# Patient Record
Sex: Female | Born: 1935 | Race: Black or African American | Hispanic: No | Smoking: Never smoker
Health system: Southern US, Community
[De-identification: ages and names within clinical notes are randomized; demographics above are authoritative.]

## PROBLEM LIST (undated history)

## (undated) DIAGNOSIS — F32A Depression, unspecified: Secondary | ICD-10-CM

## (undated) DIAGNOSIS — I10 Essential (primary) hypertension: Secondary | ICD-10-CM

## (undated) DIAGNOSIS — F329 Major depressive disorder, single episode, unspecified: Secondary | ICD-10-CM

## (undated) DIAGNOSIS — Q228 Other congenital malformations of tricuspid valve: Secondary | ICD-10-CM

## (undated) DIAGNOSIS — M8580 Other specified disorders of bone density and structure, unspecified site: Secondary | ICD-10-CM

## (undated) DIAGNOSIS — I341 Nonrheumatic mitral (valve) prolapse: Secondary | ICD-10-CM

## (undated) DIAGNOSIS — I369 Nonrheumatic tricuspid valve disorder, unspecified: Secondary | ICD-10-CM

## (undated) DIAGNOSIS — G4700 Insomnia, unspecified: Secondary | ICD-10-CM

## (undated) DIAGNOSIS — M1990 Unspecified osteoarthritis, unspecified site: Secondary | ICD-10-CM

## (undated) DIAGNOSIS — F39 Unspecified mood [affective] disorder: Secondary | ICD-10-CM

## (undated) DIAGNOSIS — J309 Allergic rhinitis, unspecified: Secondary | ICD-10-CM

## (undated) DIAGNOSIS — K219 Gastro-esophageal reflux disease without esophagitis: Secondary | ICD-10-CM

## (undated) DIAGNOSIS — E7800 Pure hypercholesterolemia, unspecified: Secondary | ICD-10-CM

## (undated) DIAGNOSIS — K635 Polyp of colon: Secondary | ICD-10-CM

## (undated) DIAGNOSIS — M810 Age-related osteoporosis without current pathological fracture: Secondary | ICD-10-CM

## (undated) HISTORY — DX: Nonrheumatic mitral (valve) prolapse: I34.1

## (undated) HISTORY — PX: CARPAL TUNNEL RELEASE: SHX101

## (undated) HISTORY — DX: Polyp of colon: K63.5

## (undated) HISTORY — DX: Other specified disorders of bone density and structure, unspecified site: M85.80

## (undated) HISTORY — DX: Major depressive disorder, single episode, unspecified: F32.9

## (undated) HISTORY — DX: Insomnia, unspecified: G47.00

## (undated) HISTORY — PX: BUNIONECTOMY: SHX129

## (undated) HISTORY — DX: Other congenital malformations of tricuspid valve: Q22.8

## (undated) HISTORY — DX: Nonrheumatic tricuspid valve disorder, unspecified: I36.9

## (undated) HISTORY — DX: Depression, unspecified: F32.A

## (undated) HISTORY — DX: Unspecified osteoarthritis, unspecified site: M19.90

## (undated) HISTORY — DX: Age-related osteoporosis without current pathological fracture: M81.0

## (undated) HISTORY — DX: Allergic rhinitis, unspecified: J30.9

## (undated) HISTORY — PX: ABDOMINAL HYSTERECTOMY: SHX81

---

## 2007-12-11 LAB — HM DEXA SCAN

## 2010-01-10 LAB — HM MAMMOGRAPHY

## 2012-03-20 ENCOUNTER — Encounter (HOSPITAL_BASED_OUTPATIENT_CLINIC_OR_DEPARTMENT_OTHER): Payer: Self-pay | Admitting: Emergency Medicine

## 2012-03-20 ENCOUNTER — Emergency Department (HOSPITAL_BASED_OUTPATIENT_CLINIC_OR_DEPARTMENT_OTHER): Payer: Medicare Other

## 2012-03-20 ENCOUNTER — Emergency Department (HOSPITAL_BASED_OUTPATIENT_CLINIC_OR_DEPARTMENT_OTHER)
Admission: EM | Admit: 2012-03-20 | Discharge: 2012-03-20 | Disposition: A | Discharge status: Home / Self Care | Payer: Medicare Other | Attending: Emergency Medicine | Admitting: Emergency Medicine

## 2012-03-20 DIAGNOSIS — Z880 Allergy status to penicillin: Secondary | ICD-10-CM | POA: Insufficient documentation

## 2012-03-20 DIAGNOSIS — M25569 Pain in unspecified knee: Secondary | ICD-10-CM | POA: Insufficient documentation

## 2012-03-20 DIAGNOSIS — Z882 Allergy status to sulfonamides status: Secondary | ICD-10-CM | POA: Insufficient documentation

## 2012-03-20 DIAGNOSIS — E7800 Pure hypercholesterolemia, unspecified: Secondary | ICD-10-CM | POA: Insufficient documentation

## 2012-03-20 DIAGNOSIS — Z79899 Other long term (current) drug therapy: Secondary | ICD-10-CM | POA: Insufficient documentation

## 2012-03-20 DIAGNOSIS — Z886 Allergy status to analgesic agent status: Secondary | ICD-10-CM | POA: Insufficient documentation

## 2012-03-20 DIAGNOSIS — Z91013 Allergy to seafood: Secondary | ICD-10-CM | POA: Insufficient documentation

## 2012-03-20 DIAGNOSIS — M25561 Pain in right knee: Secondary | ICD-10-CM

## 2012-03-20 DIAGNOSIS — E785 Hyperlipidemia, unspecified: Secondary | ICD-10-CM | POA: Insufficient documentation

## 2012-03-20 DIAGNOSIS — X500XXA Overexertion from strenuous movement or load, initial encounter: Secondary | ICD-10-CM | POA: Insufficient documentation

## 2012-03-20 DIAGNOSIS — K219 Gastro-esophageal reflux disease without esophagitis: Secondary | ICD-10-CM | POA: Insufficient documentation

## 2012-03-20 HISTORY — DX: Essential (primary) hypertension: I10

## 2012-03-20 HISTORY — DX: Pure hypercholesterolemia, unspecified: E78.00

## 2012-03-20 HISTORY — DX: Unspecified mood (affective) disorder: F39

## 2012-03-20 HISTORY — DX: Gastro-esophageal reflux disease without esophagitis: K21.9

## 2012-03-20 MED ORDER — HYDROCODONE-ACETAMINOPHEN 5-325 MG PO TABS: 2.0000 | ORAL_TABLET | Freq: Four times a day (QID) | ORAL | Status: AC | PRN

## 2012-03-20 NOTE — ED Provider Notes (Signed)
History     CSN: [id number]  Arrival date & time 03/20/12  1747   First MD Initiated Contact with Patient 03/20/12 1830      Chief Complaint  Patient presents with  . Knee Pain    (Consider location/radiation/quality/duration/timing/severity/associated sxs/prior treatment) HPI 78 year old female was trying to get into her car just prior to arrival when her right knee gave out and she almost fell to the ground. She didn't strike her right knee on the ground but did not fall completely and she was able to catch herself with her arms against the car. She was able to walk after this fall. She has no distal weakness or numbness to the right leg. There is no swelling or deformity to the right knee but she does have right knee pain which is localized without radiation or associated symptoms. She did not hit her head and she is no neck pain or back pain. She is no amnesia. She is not lightheaded. She is no chest pain shortness breath abdominal pain. She is no injury to her arms and no pain to her left leg now. She is isolated pain to the right knee but no pain to the right hip ankle or foot. There is no treatment prior to arrival. Her pain is moderately severe but she does not want pain medicines in the emergency department Past Medical History  Diagnosis Date  . Hypertension   . High cholesterol   . GERD (gastroesophageal reflux disease)   . Mood disorder     Past Surgical History  Procedure Date  . Abdominal hysterectomy   . Carpal tunnel release     No family history on file.  History  Substance Use Topics  . Smoking status: Never Smoker   . Smokeless tobacco: Not on file  . Alcohol Use: No    OB History    Grav Para Term Preterm Abortions TAB SAB Ect Mult Living                  Review of Systems 10 Systems reviewed and are negative for acute change except as noted in the HPI. Allergies  Penicillins; Shrimp; Aspirin; and Sulfa antibiotics  Home Medications   Current  Outpatient Rx  Name Route Sig Dispense Refill  . ACETAMINOPHEN 325 MG PO TABS Oral Take 650 mg by mouth every 6 (six) hours as needed. For pain.    . DEXLANSOPRAZOLE 60 MG PO CPDR Oral Take 60 mg by mouth daily.    . DULOXETINE HCL 60 MG PO CPEP Oral Take 60 mg by mouth daily.    Marland Kitchen EZETIMIBE 10 MG PO TABS Oral Take 10 mg by mouth daily.    Marland Kitchen HYDROCODONE-ACETAMINOPHEN 5-325 MG PO TABS Oral Take 2 tablets by mouth every 6 (six) hours as needed for pain. 20 tablet 0  . HYDROXYZINE HCL 25 MG PO TABS Oral Take 25 mg by mouth 3 (three) times daily as needed.    Marland Kitchen POTASSIUM CHLORIDE ER 10 MEQ PO TBCR Oral Take 10 mEq by mouth 2 (two) times daily. Patient uses this medication prn.    Marland Kitchen ROSUVASTATIN CALCIUM 20 MG PO TABS Oral Take 20 mg by mouth daily.    Marland Kitchen MICARDIS PO Oral Take 1 tablet by mouth daily.    . TOLTERODINE TARTRATE ER 4 MG PO CP24 Oral Take 4 mg by mouth daily.      BP 124/56  Pulse 63  Temp 98.3 F (36.8 C) (Oral)  Resp 16  Ht 5\' 1"  (1.549 m)  Wt 165 lb (74.844 kg)  BMI 31.18 kg/m2  SpO2 98%  Physical Exam  Nursing note and vitals reviewed. Constitutional:       Awake, alert, nontoxic appearance.  HENT:  Head: Atraumatic.  Eyes: Right eye exhibits no discharge. Left eye exhibits no discharge.  Neck: Neck supple.       Cervical spine nontender  Pulmonary/Chest: Effort normal. She exhibits no tenderness.  Abdominal: Soft. There is no tenderness. There is no rebound.  Musculoskeletal: She exhibits tenderness. She exhibits no edema.       Baseline ROM, no obvious new focal weakness. Both arms and left leg nontender. Right leg is nontender at the hip thigh calf ankle and foot. Foot is normal light touch good range of motion and intact strength and capillary refill less than 2 seconds. Right knee has tenderness anteriorly as well as the medial and lateral joint lines however the patient has active full extension as well as flexion past 90, she's negative Lachman's testing and no  instability or worsening pain with varus or valgus stress testing. She also has negative McMurray's testing.  Neurological:       Mental status and motor strength appears baseline for patient and situation.  Skin: No rash noted.  Psychiatric: She has a normal mood and affect.    ED Course  Procedures (including critical care time)  Labs Reviewed - No data to display Dg Knee Complete 4 Views Right  03/20/2012  *RADIOLOGY REPORT*  Clinical Data: Larey Seat and injured right knee.  Pain.  RIGHT KNEE - COMPLETE 4+ VIEW  Comparison: None.  Findings: Osteopenia.  No evidence of acute fracture or dislocation.  Tricompartment joint space narrowing and associated hypertrophic spurring, severe.  Chondrocalcinosis involving the lateral meniscus.  Large joint effusion with calcified loose body in the joint.  IMPRESSION: No acute osseous abnormality.  Osteopenia.  Severe tricompartment osteoarthritis secondary to CPPD.  Large joint effusion with calcified loose body.  Original Report Authenticated By: Arnell Sieving, M.D.     1. Right knee pain       MDM  Patient / Family / Caregiver informed of clinical course, understand medical decision-making process, and agree with plan.         Hurman Horn, MD 03/21/12 [id number]

## 2012-03-20 NOTE — ED Notes (Signed)
Pt's "knee gave out" when she was trying to get in the car.  Went down to pavement on her right knee.  Did not fall all the way down to the ground.  Skin is intact.

## 2013-01-06 ENCOUNTER — Ambulatory Visit (INDEPENDENT_AMBULATORY_CARE_PROVIDER_SITE_OTHER): Payer: Medicare Other | Admitting: Family Medicine

## 2013-01-06 ENCOUNTER — Encounter: Payer: Self-pay | Admitting: Family Medicine

## 2013-01-06 VITALS — BP 129/79 | HR 67 | Wt 164.0 lb

## 2013-01-06 DIAGNOSIS — N952 Postmenopausal atrophic vaginitis: Secondary | ICD-10-CM | POA: Insufficient documentation

## 2013-01-06 DIAGNOSIS — R300 Dysuria: Secondary | ICD-10-CM | POA: Insufficient documentation

## 2013-01-06 DIAGNOSIS — B3731 Acute candidiasis of vulva and vagina: Secondary | ICD-10-CM

## 2013-01-06 DIAGNOSIS — R5381 Other malaise: Secondary | ICD-10-CM | POA: Insufficient documentation

## 2013-01-06 DIAGNOSIS — B373 Candidiasis of vulva and vagina: Secondary | ICD-10-CM

## 2013-01-06 DIAGNOSIS — R5383 Other fatigue: Secondary | ICD-10-CM

## 2013-01-06 LAB — POCT URINALYSIS DIPSTICK
Bilirubin, UA: NEGATIVE
Blood, UA: NEGATIVE
Glucose, UA: NEGATIVE
Ketones, UA: NEGATIVE
Leukocytes, UA: NEGATIVE
Nitrite, UA: NEGATIVE
Protein, UA: NEGATIVE
Spec Grav, UA: 1.02
Urobilinogen, UA: NEGATIVE
pH, UA: 6.5

## 2013-01-06 MED ORDER — CLOBETASOL PROPIONATE 0.05 % EX CREA: TOPICAL_CREAM | Freq: Two times a day (BID) | CUTANEOUS | Status: AC

## 2013-01-06 MED ORDER — FLUCONAZOLE 200 MG PO TABS: ORAL_TABLET | ORAL | Status: DC

## 2013-01-06 MED ORDER — CYANOCOBALAMIN 1000 MCG/ML IJ SOLN
1000.0000 ug | Freq: Once | INTRAMUSCULAR | Status: AC
  Administered 2013-01-06: 1000 ug via INTRAMUSCULAR

## 2013-01-06 NOTE — Assessment & Plan Note (Signed)
Treating with Diflucan. 

## 2013-01-06 NOTE — Assessment & Plan Note (Signed)
She received a Vitamin B-12 shot.

## 2013-01-06 NOTE — Assessment & Plan Note (Signed)
She was given a prescription for Clobetasol cream.

## 2013-01-06 NOTE — Assessment & Plan Note (Signed)
Her U/A was clear.

## 2013-01-06 NOTE — Patient Instructions (Addendum)
1)  Vaginal Itching/Irritation:  Take one pill (Diflucan) every other day for a week and apply the cream twice day till itching has improved.   Atrophic Vaginitis Atrophic vaginitis is a problem of low levels of estrogen in women. This problem can happen at any age. It is most common in women who have gone through menopause ("the change").  HOW WILL I KNOW IF I HAVE THIS PROBLEM? You may have:  Trouble with peeing (urinating), such as:  Going to the bathroom often.  A hard time holding your pee until you reach a bathroom.  Leaking pee.  Having pain when you pee.  Itching or a burning feeling.  Vaginal bleeding and spotting.  Pain during sex.  Dryness of the vagina.  A yellow, bad-smelling fluid (discharge) coming from the vagina. HOW WILL MY DOCTOR CHECK FOR THIS PROBLEM?  During your exam, your doctor will likely find the problem.  If there is a vaginal fluid, it may be checked for infection. HOW WILL THIS PROBLEM BE TREATED? Keep the vulvar skin as clean as possible. Moisturizers and lubricants can help with some of the symptoms. Estrogen replacement can help. There are 2 ways to take estrogen:  Systemic estrogen gets estrogen to your whole body. It takes many weeks or months before the symptoms get better.  You take an estrogen pill.  You use a skin patch. This is a patch that you put on your skin.  If you still have your uterus, your doctor may ask you to take a hormone. Talk to your doctor about the right medicine for you.  Estrogen cream.  This puts estrogen only at the part of your body where you apply it. The cream is put into the vagina or put on the vulvar skin. For some women, estrogen cream works faster than pills or the patch. CAN ALL WOMEN WITH THIS PROBLEM USE ESTROGEN? No. Women with certain types of cancer, liver problems, or problems with blood clots should not take estrogen. Your doctor can help you decide the best treatment for your symptoms. Document  Released: 01/15/2008 Document Revised: 10/21/2011 Document Reviewed: 01/15/2008 [facility] Patient Information 2014 [place], [place].

## 2013-01-06 NOTE — Progress Notes (Signed)
  Subjective:    Patient ID: Lori Fitzgerald, female    DOB: 1930-09-09, 79 y.o.   MRN: [id number]  HPI  Lori Fitzgerald is here today complaining of UTI symptoms and vaginal irritation.  She has been having these symptoms for about five days.  She describes her symptoms as being moderate in nature and slowly improving.  She has tried Azo and Cranberry tablets which have helped her bladder discomfort.  She has also put topical antibiotics in her vaginal area which has worsened  her itching.    Review of Systems  Genitourinary: Positive for dysuria and frequency.      Objective:   Physical Exam  Constitutional: No distress.  Genitourinary: No vaginal discharge found.  Skin: Skin is warm and dry. There is erythema.          Assessment & Plan:

## 2013-01-26 ENCOUNTER — Ambulatory Visit: Payer: Medicare Other | Admitting: Family Medicine

## 2013-02-02 ENCOUNTER — Encounter: Payer: Self-pay | Admitting: Family Medicine

## 2013-02-02 ENCOUNTER — Ambulatory Visit (INDEPENDENT_AMBULATORY_CARE_PROVIDER_SITE_OTHER): Payer: Medicare Other | Admitting: Family Medicine

## 2013-02-02 VITALS — BP 130/75 | HR 67 | Temp 98.0°F | Resp 18 | Wt 164.0 lb

## 2013-02-02 DIAGNOSIS — G4700 Insomnia, unspecified: Secondary | ICD-10-CM

## 2013-02-02 DIAGNOSIS — M25519 Pain in unspecified shoulder: Secondary | ICD-10-CM

## 2013-02-02 DIAGNOSIS — R109 Unspecified abdominal pain: Secondary | ICD-10-CM

## 2013-02-02 DIAGNOSIS — R5381 Other malaise: Secondary | ICD-10-CM

## 2013-02-02 DIAGNOSIS — R5383 Other fatigue: Secondary | ICD-10-CM

## 2013-02-02 DIAGNOSIS — M25512 Pain in left shoulder: Secondary | ICD-10-CM

## 2013-02-02 LAB — LIPASE: Lipase: 29 U/L (ref 0–75)

## 2013-02-02 LAB — AMYLASE: Amylase: 51 U/L (ref 0–105)

## 2013-02-02 MED ORDER — CYANOCOBALAMIN 1000 MCG/ML IJ SOLN
1000.0000 ug | Freq: Once | INTRAMUSCULAR | Status: AC
  Administered 2013-02-02: 1000 ug via INTRAMUSCULAR

## 2013-02-02 MED ORDER — PROMETHAZINE HCL 12.5 MG PO TABS: ORAL_TABLET | ORAL | Status: DC

## 2013-02-02 MED ORDER — CYANOCOBALAMIN 1000 MCG/ML IJ SOLN: 1000.0000 ug | Freq: Once | INTRAMUSCULAR | Status: DC

## 2013-02-02 NOTE — Progress Notes (Signed)
  Subjective:    Patient ID: Lori Fitzgerald, female    DOB: [birth year], 79 y.o.   MRN: [id number]  [name] is in today complaining of both abdominal (RLQ) and shoulder pain (Left).     Abdominal Pain This is a new problem. The current episode started in the past 7 days. The problem occurs daily. The problem has been unchanged. The pain is located in the RLQ. The pain is at a severity of 5/10. The pain is moderate. The quality of the pain is burning. The abdominal pain radiates to the periumbilical region. Associated symptoms include constipation, headaches, nausea and vomiting (She vomited once last Wednesday.). Arthralgias: Left Shoulder  The pain is aggravated by eating, movement and belching. She has tried nothing for the symptoms. Her past medical history is significant for GERD. Abdominal surgery: Hernia & Abdominal Hysterectomy.    Review of Systems  Constitutional: Positive for appetite change. Negative for fatigue and unexpected weight change.  Respiratory: Negative for shortness of breath.   Cardiovascular: Negative for chest pain.  Gastrointestinal: Positive for nausea, vomiting (She vomited once last Wednesday.), abdominal pain and constipation.       Generalized right abd pain   Genitourinary: Negative for difficulty urinating.  Musculoskeletal: Arthralgias: Left Shoulder   Neurological: Positive for headaches. Negative for seizures.    Past Medical History  Diagnosis Date  . Hypertension   . High cholesterol   . GERD (gastroesophageal reflux disease)   . Mood disorder   . Allergic rhinitis   . Depression   . Insomnia   . Osteoarthritis   . Colon polyp   . Osteoporosis   . Osteopenia   . Mitral valve prolapse   . Tricuspid valve prolapse    Family History  Problem Relation Age of Onset  . Cancer Mother     Bladder  . Diabetes Maternal Aunt   . Diabetes Cousin    History   Social History Narrative   Marital Status: Divorced   Children:  G5 P5004 Daughters  (4) Son (1)  She lost a daughter who was HIV positive.     Pets:  None   Living Situation: Lives with daughter   Occupation:  Retired   Education:  11th grade   Tobacco Use/Exposure:  None    Alcohol Use:  None   Drug Use:  None   Diet:  Regular   Exercise:  None   Hobbies:Reading, Housework, Decorating               Objective:   Physical Exam  Constitutional: No distress.  HENT:  Nose: Nose normal.  Mouth/Throat: Oropharynx is clear and moist. Mucous membranes are dry.  Eyes: No scleral icterus.  Neck: Neck supple.  Cardiovascular: Normal rate, regular rhythm and normal heart sounds.   Pulmonary/Chest: Effort normal and breath sounds normal.  Abdominal: Soft. Bowel sounds are normal. She exhibits no distension and no mass. There is tenderness (Mild discomfort). There is no rebound and no guarding.  Musculoskeletal: She exhibits no edema. Tenderness: Left Shoulder Pain   Lymphadenopathy:    She has no cervical adenopathy.  Neurological: She is alert.  Skin: Skin is warm and dry. She is not diaphoretic.  Psychiatric: She has a normal mood and affect. Her behavior is normal. Judgment and thought content normal.          Assessment & Plan:

## 2013-02-02 NOTE — Patient Instructions (Addendum)
1)  Sleep - Get some Melatonin 5 mg and take at night; limit your caffeine; Herbal Tea called Sleepy Time or Sweet Dreams.  If you have a bad night then take 1/2 - 1 Phenergan.      Insomnia Insomnia is frequent trouble falling and/or staying asleep. Insomnia can be a long term problem or a short term problem. Both are common. Insomnia can be a short term problem when the wakefulness is related to a certain stress or worry. Long term insomnia is often related to ongoing stress during waking hours and/or poor sleeping habits. Overtime, sleep deprivation itself can make the problem worse. Every little thing feels more severe because you are overtired and your ability to cope is decreased. CAUSES   Stress, anxiety, and depression.  Poor sleeping habits.  Distractions such as TV in the bedroom.  Naps close to bedtime.  Engaging in emotionally charged conversations before bed.  Technical reading before sleep.  Alcohol and other sedatives. They may make the problem worse. They can hurt normal sleep patterns and normal dream activity.  Stimulants such as caffeine for several hours prior to bedtime.  Pain syndromes and shortness of breath can cause insomnia.  Exercise late at night.  Changing time zones may cause sleeping problems (jet lag). It is sometimes helpful to have someone observe your sleeping patterns. They should look for periods of not breathing during the night (sleep apnea). They should also look to see how long those periods last. If you live alone or observers are uncertain, you can also be observed at a sleep clinic where your sleep patterns will be professionally monitored. Sleep apnea requires a checkup and treatment. Give your caregivers your medical history. Give your caregivers observations your family has made about your sleep.  SYMPTOMS   Not feeling rested in the morning.  Anxiety and restlessness at bedtime.  Difficulty falling and staying asleep. TREATMENT    Your caregiver may prescribe treatment for an underlying medical disorders. Your caregiver can give advice or help if you are using alcohol or other drugs for self-medication. Treatment of underlying problems will usually eliminate insomnia problems.  Medications can be prescribed for short time use. They are generally not recommended for lengthy use.  Over-the-counter sleep medicines are not recommended for lengthy use. They can be habit forming.  You can promote easier sleeping by making lifestyle changes such as:  Using relaxation techniques that help with breathing and reduce muscle tension.  Exercising earlier in the day.  Changing your diet and the time of your last meal. No night time snacks.  Establish a regular time to go to bed.  Counseling can help with stressful problems and worry.  Soothing music and white noise may be helpful if there are background noises you cannot remove.  Stop tedious detailed work at least one hour before bedtime. HOME CARE INSTRUCTIONS   Keep a diary. Inform your caregiver about your progress. This includes any medication side effects. See your caregiver regularly. Take note of:  Times when you are asleep.  Times when you are awake during the night.  The quality of your sleep.  How you feel the next day. This information will help your caregiver care for you.  Get out of bed if you are still awake after 15 minutes. Read or do some quiet activity. Keep the lights down. Wait until you feel sleepy and go back to bed.  Keep regular sleeping and waking hours. Avoid naps.  Exercise regularly.  Avoid  distractions at bedtime. Distractions include watching television or engaging in any intense or detailed activity like attempting to balance the household checkbook.  Develop a bedtime ritual. Keep a familiar routine of bathing, brushing your teeth, climbing into bed at the same time each night, listening to soothing music. Routines increase the  success of falling to sleep faster.  Use relaxation techniques. This can be using breathing and muscle tension release routines. It can also include visualizing peaceful scenes. You can also help control troubling or intruding thoughts by keeping your mind occupied with boring or repetitive thoughts like the old concept of counting sheep. You can make it more creative like imagining planting one beautiful flower after another in your backyard garden.  During your day, work to eliminate stress. When this is not possible use some of the previous suggestions to help reduce the anxiety that accompanies stressful situations. MAKE SURE YOU:   Understand these instructions.  Will watch your condition.  Will get help right away if you are not doing well or get worse. Document Released: 07/26/2000 Document Revised: 10/21/2011 Document Reviewed: 08/26/2007 [facility] Patient Information 2014 [place].

## 2013-02-03 LAB — COMPLETE METABOLIC PANEL WITH GFR
ALT: 15 U/L (ref 0–35)
AST: 23 U/L (ref 0–37)
Albumin: 4.3 g/dL (ref 3.5–5.2)
Alkaline Phosphatase: 58 U/L (ref 39–117)
BUN: 21 mg/dL (ref 6–23)
CO2: 28 mEq/L (ref 19–32)
Calcium: 9.9 mg/dL (ref 8.4–10.5)
Chloride: 102 mEq/L (ref 96–112)
Creat: 0.86 mg/dL (ref 0.50–1.10)
GFR, Est African American: 74 mL/min
GFR, Est Non African American: 64 mL/min
Glucose, Bld: 98 mg/dL (ref 70–99)
Potassium: 3.9 mEq/L (ref 3.5–5.3)
Sodium: 139 mEq/L (ref 135–145)
Total Bilirubin: 0.7 mg/dL (ref 0.3–1.2)
Total Protein: 7 g/dL (ref 6.0–8.3)

## 2013-02-09 ENCOUNTER — Telehealth: Payer: Self-pay

## 2013-02-09 NOTE — Telephone Encounter (Addendum)
I called to let the patient know that her labs came back good. I left a VM for her to call us back when she's available. LB  Per Dr. Alberteen Sam, I called the patient to let her know that she can get her Rx for Promethazine filled at [facility], [facility] or [facility] for $4 since her insurance will not cover it. I left a message informing her of this information and to call about her labs. LB

## 2013-02-10 ENCOUNTER — Telehealth: Payer: Self-pay

## 2013-02-10 MED ORDER — PROMETHAZINE HCL 12.5 MG PO TABS: ORAL_TABLET | ORAL | Status: DC

## 2013-02-10 NOTE — Telephone Encounter (Signed)
Lori Fitzgerald has been notified of her lab results at this time. We also discussed the issue with her insurance not covering the Promethazine. LB

## 2013-03-09 ENCOUNTER — Other Ambulatory Visit: Payer: Self-pay | Admitting: *Deleted

## 2013-03-09 MED ORDER — ROSUVASTATIN CALCIUM 20 MG PO TABS: 20.0000 mg | ORAL_TABLET | Freq: Every day | ORAL | Status: DC

## 2013-03-09 NOTE — Telephone Encounter (Signed)
I am refilling her Crestor only for 30 days.  She will also run out of Cymbalta and Tramadol if she is still taking them.  So she needs an appt to see Dr Alberteen Sam and get these 3 refills extended, her lipid and the rest of her labs were normal so she may not need labs. Please schedule her if she calls. Thanks

## 2013-03-17 DIAGNOSIS — M25519 Pain in unspecified shoulder: Secondary | ICD-10-CM | POA: Insufficient documentation

## 2013-03-17 DIAGNOSIS — G4700 Insomnia, unspecified: Secondary | ICD-10-CM | POA: Insufficient documentation

## 2013-03-17 DIAGNOSIS — R109 Unspecified abdominal pain: Secondary | ICD-10-CM | POA: Insufficient documentation

## 2013-03-17 NOTE — Assessment & Plan Note (Signed)
She is to limit her caffeine and try some melatonin.

## 2013-03-17 NOTE — Assessment & Plan Note (Signed)
Checking a CMET, lipase and amylase.

## 2013-03-17 NOTE — Assessment & Plan Note (Signed)
She received an injection of Vitamin B-12.

## 2013-03-17 NOTE — Assessment & Plan Note (Addendum)
The patient's left shoulder was cleaned with Betadine.  2 cc of Lidocaine 1%/ 2 cc of Marcaine 0.25%/ 1 cc of Kenalog (40 mg) was injected posteriorly under the acromium.  The patient tolerated the procedure fine.

## 2013-04-08 ENCOUNTER — Encounter: Payer: Self-pay | Admitting: Family Medicine

## 2013-04-08 ENCOUNTER — Ambulatory Visit (INDEPENDENT_AMBULATORY_CARE_PROVIDER_SITE_OTHER): Payer: Medicare Other | Admitting: Family Medicine

## 2013-04-08 VITALS — BP 120/76 | HR 67 | Resp 16 | Wt 168.0 lb

## 2013-04-08 DIAGNOSIS — E876 Hypokalemia: Secondary | ICD-10-CM

## 2013-04-08 DIAGNOSIS — E785 Hyperlipidemia, unspecified: Secondary | ICD-10-CM

## 2013-04-08 DIAGNOSIS — R110 Nausea: Secondary | ICD-10-CM

## 2013-04-08 DIAGNOSIS — R5381 Other malaise: Secondary | ICD-10-CM

## 2013-04-08 DIAGNOSIS — R52 Pain, unspecified: Secondary | ICD-10-CM

## 2013-04-08 DIAGNOSIS — Z23 Encounter for immunization: Secondary | ICD-10-CM

## 2013-04-08 MED ORDER — PROMETHAZINE HCL 12.5 MG PO TABS: ORAL_TABLET | ORAL | Status: AC

## 2013-04-08 MED ORDER — DULOXETINE HCL 60 MG PO CPEP: 60.0000 mg | ORAL_CAPSULE | Freq: Every day | ORAL | Status: AC

## 2013-04-08 MED ORDER — ROSUVASTATIN CALCIUM 20 MG PO TABS: 20.0000 mg | ORAL_TABLET | Freq: Every day | ORAL | Status: AC

## 2013-04-08 MED ORDER — POTASSIUM CHLORIDE ER 10 MEQ PO TBCR: EXTENDED_RELEASE_TABLET | ORAL | Status: AC

## 2013-04-08 MED ORDER — CYANOCOBALAMIN 1000 MCG/ML IJ SOLN
1000.0000 ug | Freq: Once | INTRAMUSCULAR | Status: AC
  Administered 2013-04-08: 1000 ug via INTRAMUSCULAR

## 2013-04-08 NOTE — Progress Notes (Signed)
  Subjective:    Patient ID: Lori Fitzgerald, female    DOB: 1931/07/29, 79 y.o.   MRN: [id number]  HPI  [name] is here today to discuss the conditions listed below and to get her medications refilled.   1)  Hyperlipidemia:  She has done well with her Crestor 20 mg and Zetia 10 mg.  She needs her Crestor refilled today.   2)  Generalized Pain:  She has been doing well with her Cymbalta but had run out her prescription.    3)  Fatigue:  She needs her B-12 injection today   4)  BP: Her BP is perfect on Micardis HCT 80/25 mg daily.     Review of Systems  Constitutional: Negative.   HENT: Negative.   Eyes: Negative.   Respiratory: Negative.   Cardiovascular: Negative.   Gastrointestinal: Negative.   Endocrine: Negative.   Genitourinary: Negative.   Musculoskeletal: Negative.   Skin: Negative.   Allergic/Immunologic: Negative.   Neurological: Negative.   Hematological: Negative.   Psychiatric/Behavioral: Negative.     Past Medical History  Diagnosis Date  . Hypertension   . High cholesterol   . GERD (gastroesophageal reflux disease)   . Mood disorder   . Allergic rhinitis   . Depression   . Insomnia   . Osteoarthritis   . Colon polyp   . Osteoporosis   . Osteopenia   . Mitral valve prolapse   . Tricuspid valve prolapse     Family History  Problem Relation Age of Onset  . Cancer Mother     Bladder  . Diabetes Maternal Aunt   . Diabetes Cousin     History   Social History Narrative   Marital Status: Divorced   Children:  G5 P5004 Daughters (4) Son (1)  She lost a daughter who was HIV positive.     Pets:  None   Living Situation: Lives with daughter   Occupation:  Retired   Education:  11th grade   Tobacco Use/Exposure:  None    Alcohol Use:  None   Drug Use:  None   Diet:  Regular   Exercise:  None   Hobbies:Reading, Housework, Decorating               Objective:   Physical Exam  Vitals reviewed. Constitutional: She is oriented to person,  place, and time.  Eyes: Conjunctivae are normal. No scleral icterus.  Neck: Neck supple. No thyromegaly present.  Cardiovascular: Normal rate, regular rhythm and normal heart sounds.   Pulmonary/Chest: Effort normal and breath sounds normal.  Musculoskeletal: She exhibits no edema and no tenderness.  Lymphadenopathy:    She has no cervical adenopathy.  Neurological: She is alert and oriented to person, place, and time.  Skin: Skin is warm and dry.  Psychiatric: She has a normal mood and affect. Her behavior is normal. Judgment and thought content normal.          Assessment & Plan:

## 2013-05-16 ENCOUNTER — Encounter: Payer: Self-pay | Admitting: Family Medicine

## 2013-06-04 DIAGNOSIS — R52 Pain, unspecified: Secondary | ICD-10-CM | POA: Insufficient documentation

## 2013-06-04 DIAGNOSIS — R110 Nausea: Secondary | ICD-10-CM | POA: Insufficient documentation

## 2013-06-04 DIAGNOSIS — E785 Hyperlipidemia, unspecified: Secondary | ICD-10-CM | POA: Insufficient documentation

## 2013-06-04 DIAGNOSIS — Z23 Encounter for immunization: Secondary | ICD-10-CM | POA: Insufficient documentation

## 2013-06-04 DIAGNOSIS — E876 Hypokalemia: Secondary | ICD-10-CM | POA: Insufficient documentation

## 2013-06-04 NOTE — Assessment & Plan Note (Signed)
The patient confirmed that they are not allergic to eggs and have never had a bad reaction with the flu shot in the past.  The vaccination was given without difficulty.   

## 2013-06-04 NOTE — Assessment & Plan Note (Signed)
Her generalized pain is better when she is on Cymbalta.  She was given a prescription for 60 mg.

## 2013-06-04 NOTE — Assessment & Plan Note (Signed)
Refilled her Crestor.   

## 2013-06-04 NOTE — Assessment & Plan Note (Signed)
Her K-Dur was refilled.

## 2013-06-04 NOTE — Assessment & Plan Note (Signed)
She received a Vitamin B-12 injection without difficulty.

## 2013-07-15 ENCOUNTER — Encounter: Payer: Self-pay | Admitting: *Deleted

## 2013-07-27 ENCOUNTER — Other Ambulatory Visit: Payer: Self-pay | Admitting: Family Medicine

## 2013-09-01 ENCOUNTER — Other Ambulatory Visit: Payer: Self-pay | Admitting: Family Medicine

## 2013-09-02 NOTE — Telephone Encounter (Signed)
Lori Fitzgerald.  I denied five meds for her.  She needs an appointment.  Thanks PG

## 2013-09-03 ENCOUNTER — Telehealth: Payer: Self-pay | Admitting: *Deleted

## 2013-09-03 NOTE — Telephone Encounter (Signed)
I called Maureen RalphsVivian and she is no longer coming to our practice.  She is receiving care closer to home.  KL

## 2013-10-06 ENCOUNTER — Other Ambulatory Visit: Payer: Self-pay | Admitting: Family Medicine

## 2013-11-03 ENCOUNTER — Other Ambulatory Visit: Payer: Self-pay | Admitting: Family Medicine

## 2013-11-27 ENCOUNTER — Other Ambulatory Visit: Payer: Self-pay | Admitting: Family Medicine

## 2013-12-24 ENCOUNTER — Other Ambulatory Visit: Payer: Self-pay | Admitting: Family Medicine

## 2013-12-29 ENCOUNTER — Other Ambulatory Visit: Payer: Self-pay | Admitting: Family Medicine

## 2014-03-24 ENCOUNTER — Other Ambulatory Visit: Payer: Self-pay | Admitting: Family Medicine

## 2015-09-05 ENCOUNTER — Encounter (HOSPITAL_BASED_OUTPATIENT_CLINIC_OR_DEPARTMENT_OTHER): Payer: Self-pay

## 2015-09-05 ENCOUNTER — Emergency Department (HOSPITAL_BASED_OUTPATIENT_CLINIC_OR_DEPARTMENT_OTHER): Payer: Medicare Other

## 2015-09-05 ENCOUNTER — Emergency Department (HOSPITAL_BASED_OUTPATIENT_CLINIC_OR_DEPARTMENT_OTHER)
Admission: EM | Admit: 2015-09-05 | Discharge: 2015-09-05 | Disposition: A | Discharge status: Home / Self Care | Payer: Medicare Other | Attending: Emergency Medicine | Admitting: Emergency Medicine

## 2015-09-05 DIAGNOSIS — S0990XA Unspecified injury of head, initial encounter: Secondary | ICD-10-CM | POA: Insufficient documentation

## 2015-09-05 DIAGNOSIS — Y9389 Activity, other specified: Secondary | ICD-10-CM | POA: Insufficient documentation

## 2015-09-05 DIAGNOSIS — K219 Gastro-esophageal reflux disease without esophagitis: Secondary | ICD-10-CM | POA: Insufficient documentation

## 2015-09-05 DIAGNOSIS — F329 Major depressive disorder, single episode, unspecified: Secondary | ICD-10-CM | POA: Diagnosis not present

## 2015-09-05 DIAGNOSIS — Y9289 Other specified places as the place of occurrence of the external cause: Secondary | ICD-10-CM | POA: Diagnosis not present

## 2015-09-05 DIAGNOSIS — I10 Essential (primary) hypertension: Secondary | ICD-10-CM | POA: Insufficient documentation

## 2015-09-05 DIAGNOSIS — E7800 Pure hypercholesterolemia, unspecified: Secondary | ICD-10-CM | POA: Insufficient documentation

## 2015-09-05 DIAGNOSIS — Y998 Other external cause status: Secondary | ICD-10-CM | POA: Diagnosis not present

## 2015-09-05 DIAGNOSIS — Z8739 Personal history of other diseases of the musculoskeletal system and connective tissue: Secondary | ICD-10-CM | POA: Insufficient documentation

## 2015-09-05 DIAGNOSIS — M791 Myalgia, unspecified site: Secondary | ICD-10-CM

## 2015-09-05 DIAGNOSIS — Z86010 Personal history of colonic polyps: Secondary | ICD-10-CM | POA: Insufficient documentation

## 2015-09-05 DIAGNOSIS — W19XXXA Unspecified fall, initial encounter: Secondary | ICD-10-CM

## 2015-09-05 DIAGNOSIS — F39 Unspecified mood [affective] disorder: Secondary | ICD-10-CM | POA: Diagnosis not present

## 2015-09-05 DIAGNOSIS — W01198A Fall on same level from slipping, tripping and stumbling with subsequent striking against other object, initial encounter: Secondary | ICD-10-CM | POA: Insufficient documentation

## 2015-09-05 DIAGNOSIS — S3992XA Unspecified injury of lower back, initial encounter: Secondary | ICD-10-CM | POA: Insufficient documentation

## 2015-09-05 DIAGNOSIS — Z8669 Personal history of other diseases of the nervous system and sense organs: Secondary | ICD-10-CM | POA: Insufficient documentation

## 2015-09-05 DIAGNOSIS — Z880 Allergy status to penicillin: Secondary | ICD-10-CM | POA: Insufficient documentation

## 2015-09-05 DIAGNOSIS — Z79899 Other long term (current) drug therapy: Secondary | ICD-10-CM | POA: Diagnosis not present

## 2015-09-05 MED ORDER — METHOCARBAMOL 500 MG PO TABS
500.0000 mg | ORAL_TABLET | Freq: Once | ORAL | Status: AC
Start: 2015-09-05 — End: 2015-09-05
  Administered 2015-09-05: 500 mg via ORAL
  Filled 2015-09-05: qty 1

## 2015-09-05 MED ORDER — METHOCARBAMOL 500 MG PO TABS: 500.0000 mg | ORAL_TABLET | Freq: Two times a day (BID) | ORAL | Status: AC | PRN

## 2015-09-05 NOTE — ED Notes (Signed)
C/o pain in legs and feels pain in neck, shoulders as well

## 2015-09-05 NOTE — ED Provider Notes (Signed)
Pt seen and evaluated.  D/W Stevi Barrett, PA-C.  Pt with fall several days ago.  No specific areas of pain, but general myalgias.  Did strike head, but no LOC, no hematoma, normal neuro exam, and normal head CT.  Agree with DC for expectant management.  Zyriah Mask JamesRolland Porter/24/17 [phone]

## 2015-09-05 NOTE — ED Provider Notes (Signed)
CSN: [id number]     Arrival date & time 09/05/15  1453 History   First MD Initiated Contact with Patient 09/05/15 1537     Chief Complaint  Patient presents with  . Fall   HPI  Lori Fitzgerald is an 82 year old female with PMHx noted below presenting after a fall. She was getting into a vehicle when she lost her balance and fell backwards. She states that she fell flat on her back and struck her head on the pavement. This occurred 2 days ago. She denies loss of consciousness but states she felt "really out of it" immediately after the fall. She states that she did not seek medical care at the time she thought she would improve on her own. She is now complaining of diffuse myalgias. She states that the entire backside of her body hurts from where she landed on the pavement. She denies focal pain. She also reports intermittent headache since the fall. She is unable to characterize them; she states "It just hurts sometimes". She also states that her eyes have been intermittently blurry since falling. She denies difficulty ambulating. She does not take blood thinners. Denies fevers, chills, dizziness, lightheadedness, syncope, neck pain, chest pain, shortness of breath, cough, abdominal pain, nausea, vomiting, wounds sustained in the fall or arthralgias. She lives with her daughter.   Past Medical History  Diagnosis Date  . Hypertension   . High cholesterol   . GERD (gastroesophageal reflux disease)   . Mood disorder (HCC)   . Allergic rhinitis   . Depression   . Insomnia   . Osteoarthritis   . Colon polyp   . Osteoporosis   . Osteopenia   . Mitral valve prolapse   . Tricuspid valve prolapse    Past Surgical History  Procedure Laterality Date  . Abdominal hysterectomy    . Carpal tunnel release    . Bunionectomy     Family History  Problem Relation Age of Onset  . Cancer Mother     Bladder  . Diabetes Maternal Aunt   . Diabetes Cousin    Social History  Substance Use Topics  .  Smoking status: Never Smoker   . Smokeless tobacco: Never Used  . Alcohol Use: No   OB History    No data available     Review of Systems  Constitutional: Negative for fever and chills.  Eyes: Positive for visual disturbance. Negative for photophobia.  Respiratory: Negative for shortness of breath.   Cardiovascular: Negative for chest pain.  Gastrointestinal: Negative for nausea, vomiting and abdominal pain.  Musculoskeletal: Positive for myalgias (diffuse). Negative for gait problem and neck pain.  Skin: Negative for wound.  Neurological: Positive for headaches. Negative for dizziness, syncope, weakness, light-headedness and numbness.  All other systems reviewed and are negative.     Allergies  Penicillins; Shrimp; Aspirin; and Sulfa antibiotics  Home Medications   Prior to Admission medications   Medication Sig Start Date End Date Taking? Authorizing Provider  traMADol (ULTRAM) 50 MG tablet Take by mouth every 6 (six) hours as needed.   Yes Historical Provider, MD  DEXILANT 60 MG capsule TAKE 1 CAPSULE BY MOUTH ONCE DAILY FOR 30 DAYS 07/27/13   Gillian Scarce, MD  DULoxetine (CYMBALTA) 60 MG capsule Take 1 capsule (60 mg total) by mouth daily. 04/08/13 04/08/14  Gillian Scarce, MD  hydrOXYzine (ATARAX/VISTARIL) 25 MG tablet Take 25 mg by mouth 3 (three) times daily as needed.    Historical Provider, MD  methocarbamol (ROBAXIN) 500 MG tablet Take 1 tablet (500 mg total) by mouth 2 (two) times daily as needed for muscle spasms. 09/05/15   Soley Harriss, PA-C  potassium chloride (K-DUR) 10 MEQ tablet Take 1 tab po daily 04/08/13 04/08/14  Gillian Scarce, MD  rosuvastatin (CRESTOR) 20 MG tablet Take 1 tablet (20 mg total) by mouth daily. 04/08/13 04/08/14  Gillian Scarce, MD  telmisartan-hydrochlorothiazide (MICARDIS HCT) 80-25 MG per tablet  12/08/12   Historical Provider, MD  tolterodine (DETROL LA) 4 MG 24 hr capsule Take 4 mg by mouth daily.    Historical Provider, MD  VOLTAREN 1 %  GEL  12/08/12   Historical Provider, MD  ZETIA 10 MG tablet TAKE 1 TABLET BY MOUTH EVERY DAY FOR CHOLESTEROL 07/27/13   Gillian Scarce, MD   BP 153/60 mmHg  Pulse 59  Temp(Src) 98.1 F (36.7 C) (Oral)  Resp 18  Ht 5' (1.524 m)  Wt 72.576 kg  BMI 31.25 kg/m2  SpO2 99% Physical Exam  Constitutional: She is oriented to person, place, and time. She appears well-developed and well-nourished. No distress.  HENT:  Head: Normocephalic and atraumatic.  Mouth/Throat: Oropharynx is clear and moist.  Eyes: Conjunctivae and EOM are normal. Pupils are equal, round, and reactive to light. Right eye exhibits no discharge. Left eye exhibits no discharge. No scleral icterus.  Neck: Normal range of motion. Neck supple.  No focal midline tenderness over C spine. No bony deformities or step offs. FROM intact.   Cardiovascular: Normal rate, regular rhythm and intact distal pulses.   Pulmonary/Chest: Effort normal and breath sounds normal. No respiratory distress. She has no wheezes. She has no rales. She exhibits no tenderness.  Abdominal: Soft. There is no tenderness. There is no rebound and no guarding.  Musculoskeletal: Normal range of motion.  Moves all extremities spontaneously. No edema or deformity of the joints. Full range of motion of all large joints intact. No tenderness to palpation. No focal tenderness over the joints or spine. Pt ambulates independently in ED.   Neurological: She is alert and oriented to person, place, and time. No cranial nerve deficit.  Cranial nerves 3-12 tested and intact. 5/5 strength in all major muscle groups. Sensation to light touch intact throughout. Coordinated finger to nose and heel to shin.   Skin: Skin is warm and dry.  Psychiatric: She has a normal mood and affect. Her behavior is normal.  Nursing note and vitals reviewed.   ED Course  Procedures (including critical care time) Labs Review Labs Reviewed - No data to display  Imaging Review Ct Head Wo  Contrast  09/05/2015  CLINICAL DATA:  Status post fall with a blow to the back of the head 09/02/2005. Initial encounter. EXAM: CT HEAD WITHOUT CONTRAST CT CERVICAL SPINE WITHOUT CONTRAST TECHNIQUE: Multidetector CT imaging of the head and cervical spine was performed following the standard protocol without intravenous contrast. Multiplanar CT image reconstructions of the cervical spine were also generated. COMPARISON:  None. FINDINGS: CT HEAD FINDINGS There is some cortical atrophy and chronic microvascular ischemic change. No evidence of acute intracranial abnormality including hemorrhage, infarct, mass lesion, mass effect, midline shift or abnormal extra-axial fluid collection is identified. There is no hydrocephalus or pneumocephalus. The calvarium is intact. Imaged paranasal sinuses and mastoid air cells are clear. CT CERVICAL SPINE FINDINGS There is no fracture or malalignment of the cervical spine. Calcified disc protrusions are seen at C3-4, C4-5, C5-6 and C6-7. Facet arthropathy is noted at C7-T1.  Paraspinous soft tissue structures are unremarkable. IMPRESSION: No acute abnormality head or cervical spine. Cortical atrophy and chronic microvascular ischemic change. Cervical spondylosis. Electronically Signed   By: Drusilla Kanner M.D.   On: 09/05/2015 16:51   Ct Cervical Spine Wo Contrast  09/05/2015  CLINICAL DATA:  Status post fall with a blow to the back of the head 09/02/2005. Initial encounter. EXAM: CT HEAD WITHOUT CONTRAST CT CERVICAL SPINE WITHOUT CONTRAST TECHNIQUE: Multidetector CT imaging of the head and cervical spine was performed following the standard protocol without intravenous contrast. Multiplanar CT image reconstructions of the cervical spine were also generated. COMPARISON:  None. FINDINGS: CT HEAD FINDINGS There is some cortical atrophy and chronic microvascular ischemic change. No evidence of acute intracranial abnormality including hemorrhage, infarct, mass lesion, mass effect,  midline shift or abnormal extra-axial fluid collection is identified. There is no hydrocephalus or pneumocephalus. The calvarium is intact. Imaged paranasal sinuses and mastoid air cells are clear. CT CERVICAL SPINE FINDINGS There is no fracture or malalignment of the cervical spine. Calcified disc protrusions are seen at C3-4, C4-5, C5-6 and C6-7. Facet arthropathy is noted at C7-T1. Paraspinous soft tissue structures are unremarkable. IMPRESSION: No acute abnormality head or cervical spine. Cortical atrophy and chronic microvascular ischemic change. Cervical spondylosis. Electronically Signed   By: Drusilla Kanner M.D.   On: 09/05/2015 16:51   I have personally reviewed and evaluated these images and lab results as part of my medical decision-making.   EKG Interpretation None      MDM   Final diagnoses:  Fall, initial encounter  Generalized muscle ache   82 year old female presenting after a fall. Complaining of diffuse myalgias and intermittent headaches. No change in ambulatory ability. VSS. Pt is non-toxic appearing. Non-focal neurological exam. No midline spinal tenderness or bony deformity of the C spine. All extremities are neurovascularly intact without pain to palpation or restricted ROM. Radiology without acute abnormality. Patient is able to ambulate without difficulty in the ED and will be discharged home with symptomatic therapy. Pt given robaxin in ED which she reports improved her pain. Will discharge home with short course. discussed side effects of robaxin and to only take when her daughter is home and when she is not about to be active. Pt has been instructed to follow up with their doctor if symptoms persist. Home conservative therapies for pain including OTC pain relievers, ice and heat tx have been discussed. Pt is hemodynamically stable, in NAD. Pain has been managed in ED & pt has no complaints prior to dc.      Rolm Gala Juliannah Ohmann, PA-C 09/05/15 1815  Rolland Porter,  MD 09/13/15 [phone]

## 2015-09-05 NOTE — ED Notes (Signed)
Pt reports 2 days ago fell backwards while getting into car, no loc.  Not on blood thinners.  Diffuse pain reported throughout body.

## 2015-09-05 NOTE — ED Notes (Signed)
DC instructions reviewed with pt, discussed Rx as written by EDP, also pt teaching done re: safety while taking muscle relaxants. Also informed that EDP recommends follow up with primary MD. Opportunity for questions provided. Teach Back Method used. Pt escorted to POV via wheelchair by EMT. Family with pt

## 2015-09-05 NOTE — Discharge Instructions (Signed)
Schedule a follow up appointment with your PCP.   Musculoskeletal Pain Musculoskeletal pain is muscle and boney aches and pains. These pains can occur in any part of the body. Your caregiver may treat you without knowing the cause of the pain. They may treat you if blood or urine tests, X-rays, and other tests were normal.  CAUSES There is often not a definite cause or reason for these pains. These pains may be caused by a type of germ (virus). The discomfort may also come from overuse. Overuse includes working out too hard when your body is not fit. Boney aches also come from weather changes. Bone is sensitive to atmospheric pressure changes. HOME CARE INSTRUCTIONS   Ask when your test results will be ready. Make sure you get your test results.  Only take over-the-counter or prescription medicines for pain, discomfort, or fever as directed by your caregiver. If you were given medications for your condition, do not drive, operate machinery or power tools, or sign legal documents for 24 hours. Do not drink alcohol. Do not take sleeping pills or other medications that may interfere with treatment.  Continue all activities unless the activities cause more pain. When the pain lessens, slowly resume normal activities. Gradually increase the intensity and duration of the activities or exercise.  During periods of severe pain, bed rest may be helpful. Lay or sit in any position that is comfortable.  Putting ice on the injured area.  Put ice in a bag.  Place a towel between your skin and the bag.  Leave the ice on for 15 to 20 minutes, 3 to 4 times a day.  Follow up with your caregiver for continued problems and no reason can be found for the pain. If the pain becomes worse or does not go away, it may be necessary to repeat tests or do additional testing. Your caregiver may need to look further for a possible cause. SEEK IMMEDIATE MEDICAL CARE IF:  You have pain that is getting worse and is not  relieved by medications.  You develop chest pain that is associated with shortness or breath, sweating, feeling sick to your stomach (nauseous), or throw up (vomit).  Your pain becomes localized to the abdomen.  You develop any new symptoms that seem different or that concern you. MAKE SURE YOU:   Understand these instructions.  Will watch your condition.  Will get help right away if you are not doing well or get worse.   This information is not intended to replace advice given to you by your health care provider. Make sure you discuss any questions you have with your health care provider.   Document Released: 07/29/2005 Document Revised: 10/21/2011 Document Reviewed: 04/02/2013 Elsevier Interactive Patient Education [employer]

## 2015-09-05 NOTE — ED Notes (Signed)
Pt states she fell this past Sunday onto the ground. Pt states that "legs gave away", denies any chest pain, SOB, or dizziness prior to event

## 2016-03-20 IMAGING — CT CT HEAD W/O CM
4 of 5 series · 16 of 47 positions shown, 17 images · non-contrast
Comparison: None.

CLINICAL DATA: Status post fall with a blow to the back of the head
09/02/2005. Initial encounter.

EXAM:
CT HEAD WITHOUT CONTRAST
CT CERVICAL SPINE WITHOUT CONTRAST
TECHNIQUE: Multidetector CT imaging of the head and cervical spine was
performed following the standard protocol without intravenous
contrast. Multiplanar CT image reconstructions of the cervical spine
were also generated.

[Series 3: coronal bone · coronal · 0.22mm/px · 3 of 30 slices shown]
[im 10/30  brain]
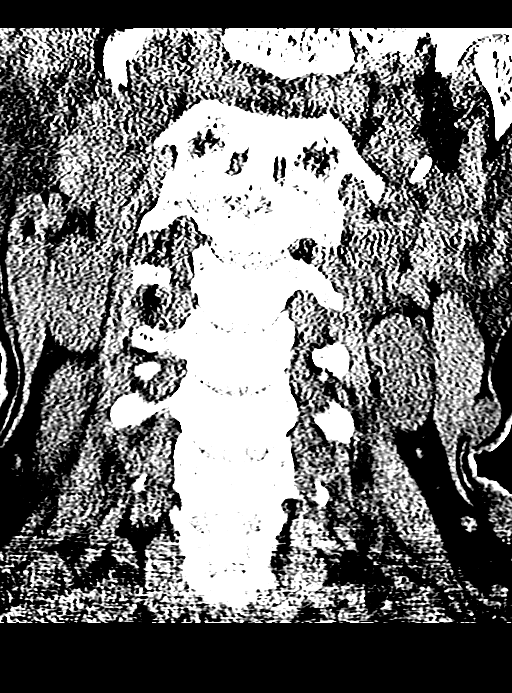
[im 13/30  brain]
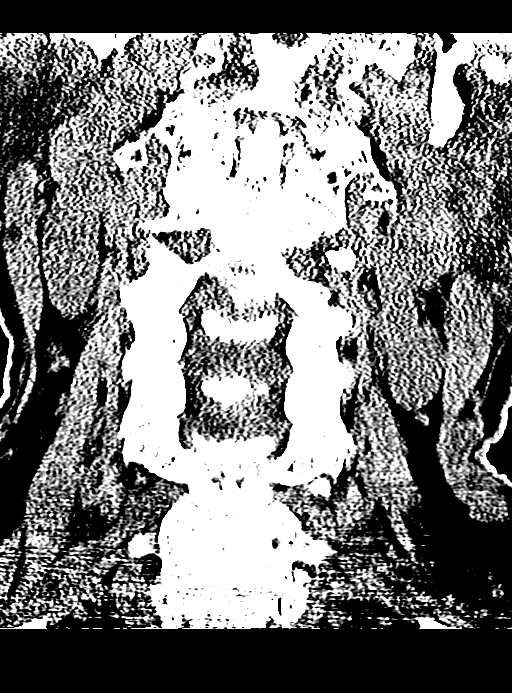
[im 17/30  brain]
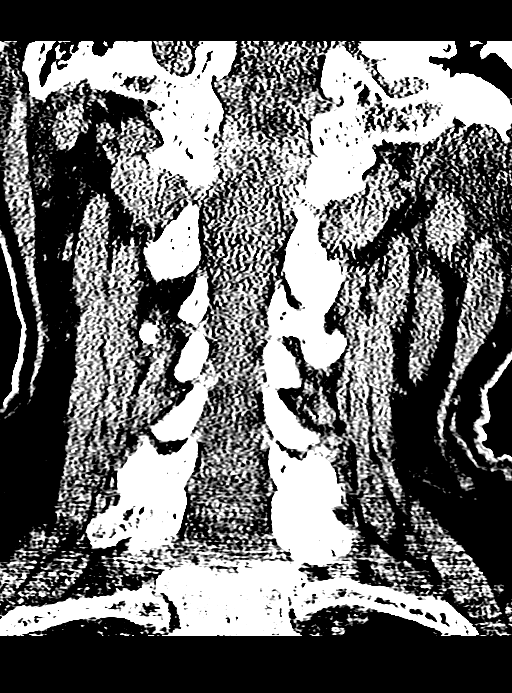

[Series 4: sagittal bone · sagittal · 0.26mm/px · 3 of 31 slices shown]
[im 11/31  brain]
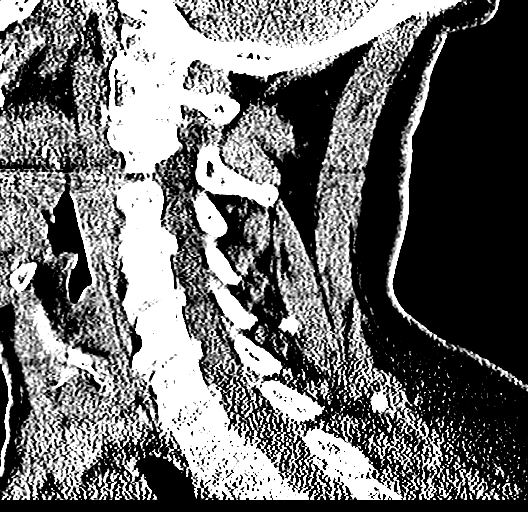
[im 16/31  brain]
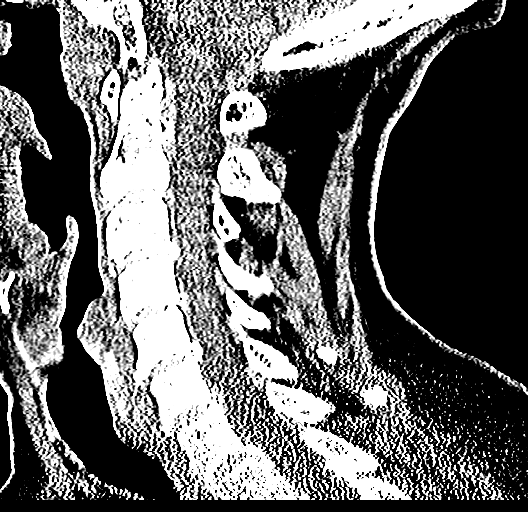
[im 21/31  brain]
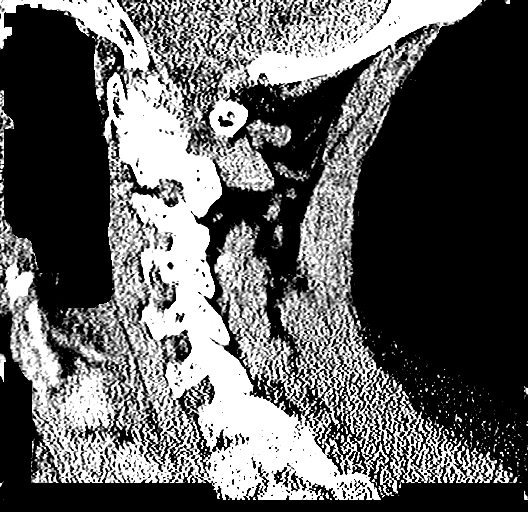

[Series 5: headseq 4.8 h30s · axial · 0.46mm/px · z∈[-75,+15]mm · 4 of 30 slices shown, 5 images]
[im 6/30  brain]
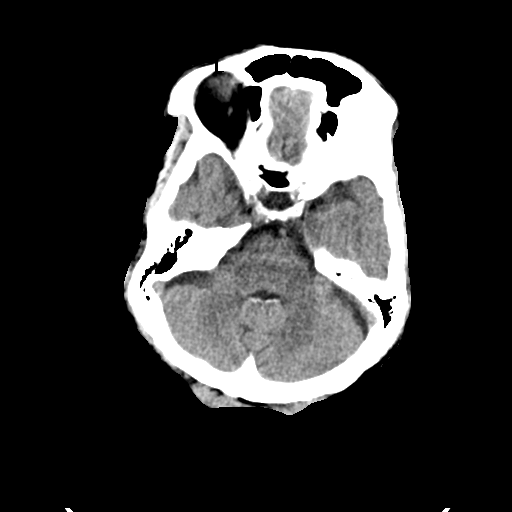
[im 6/30  bone]
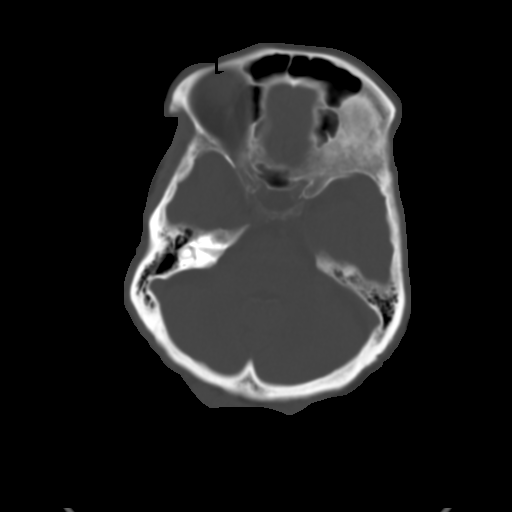
[im 12/30  brain]
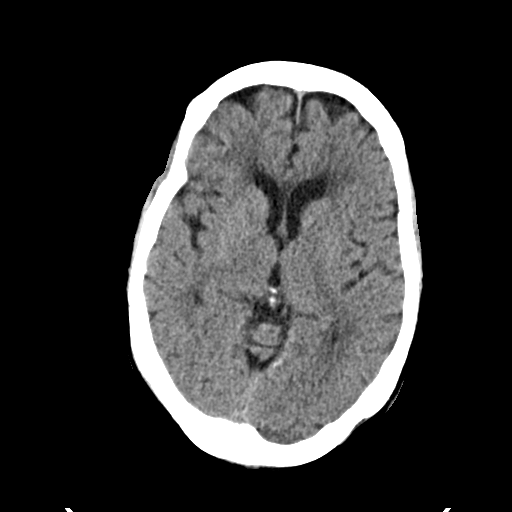
[im 18/30  brain]
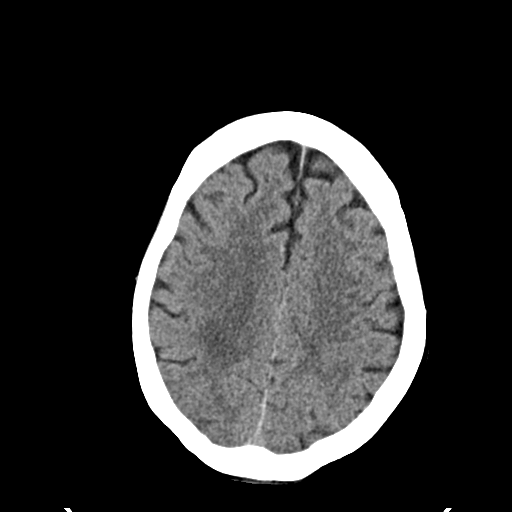
[im 24/30  brain]
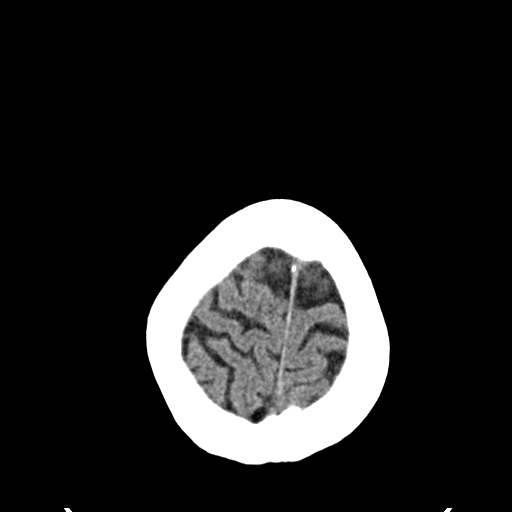

[Series 9: orthogonal axials · axial · 0.18mm/px · z∈[-275,-198]mm · 6 of 75 slices shown]
[im 7/75  brain]
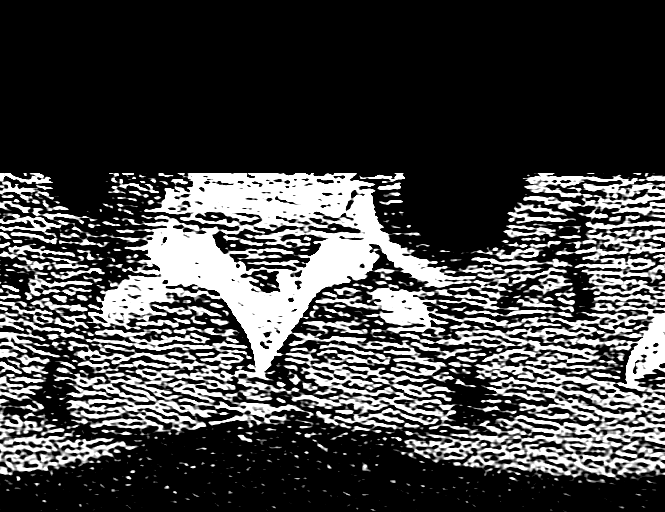
[im 19/75  brain]
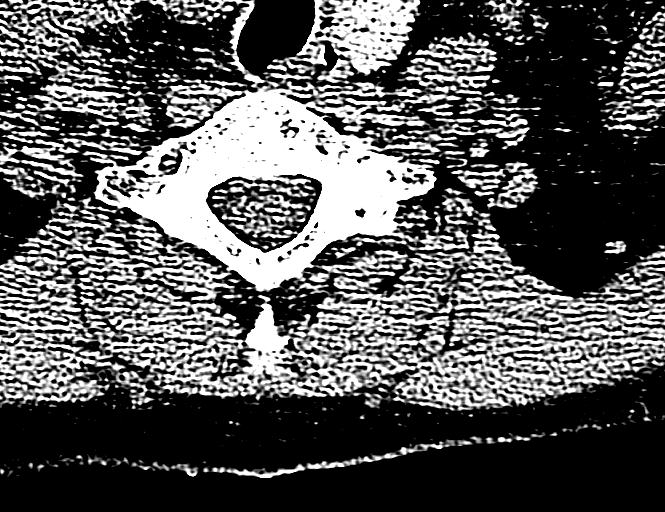
[im 25/75  brain]
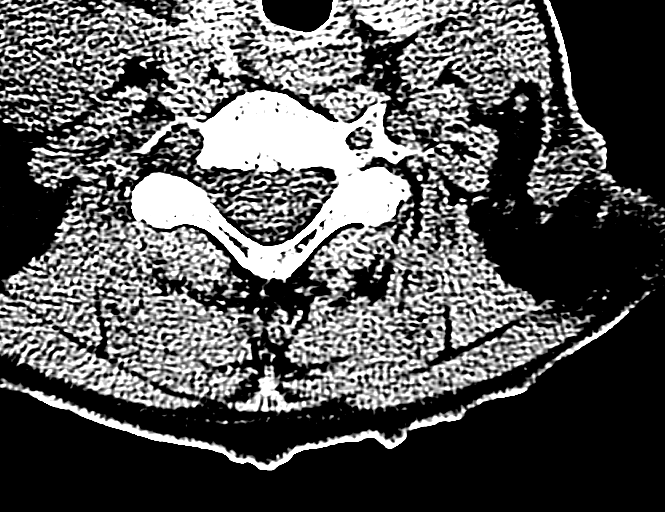
[im 31/75  brain]
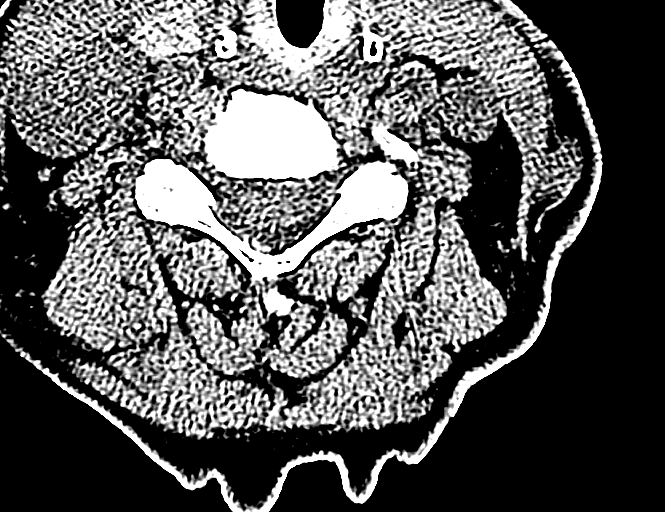
[im 44/75  brain]
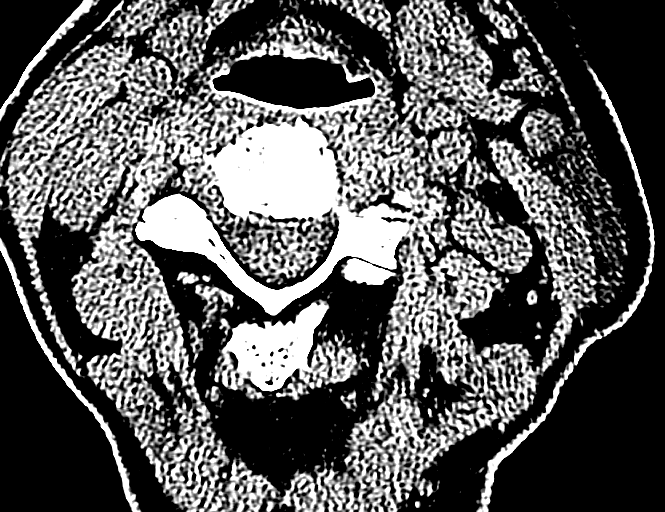
[im 50/75  brain]
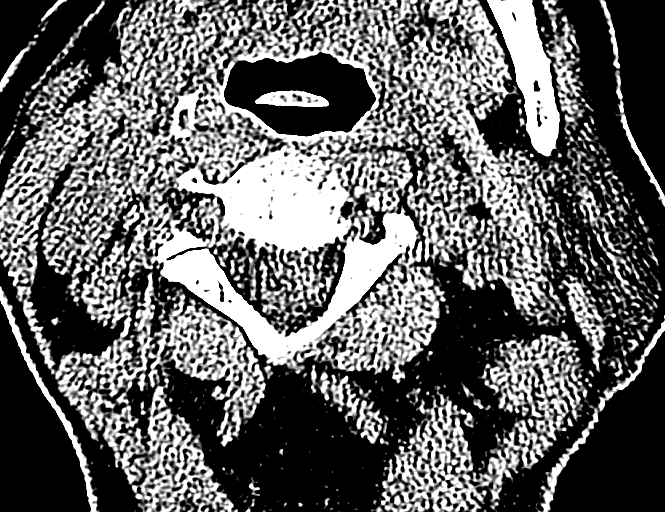

[16 of 47 positions shown; findings below may reference images not displayed]

FINDINGS: CT HEAD FINDINGS

There is some cortical atrophy and chronic microvascular ischemic
change. No evidence of acute intracranial abnormality including
hemorrhage, infarct, mass lesion, mass effect, midline shift or
abnormal extra-axial fluid collection is identified. There is no
hydrocephalus or pneumocephalus. The calvarium is intact. Imaged
paranasal sinuses and mastoid air cells are clear.

CT CERVICAL SPINE FINDINGS

There is no fracture or malalignment of the cervical spine.
Calcified disc protrusions are seen at C3-4, C4-5, C5-6 and C6-7.
Facet arthropathy is noted at C7-T1. Paraspinous soft tissue
structures are unremarkable.
IMPRESSION: No acute abnormality head or cervical spine.

Cortical atrophy and chronic microvascular ischemic change.

Cervical spondylosis.

## 2019-10-15 ENCOUNTER — Ambulatory Visit: Payer: Self-pay

## 2019-10-17 ENCOUNTER — Ambulatory Visit: Payer: Medicare Other | Attending: Internal Medicine

## 2019-10-17 DIAGNOSIS — Z23 Encounter for immunization: Secondary | ICD-10-CM

## 2019-10-17 NOTE — Progress Notes (Signed)
   Covid-19 Vaccination Clinic  Name:  Evalyn Shultis    MRN: [id number] DOB: 01/19/1931  10/17/2019  Ms. Blackwelder was observed post Covid-19 immunization for 15 minutes without incident. She was provided with Vaccine Information Sheet and instruction to access the V-Safe system.   Ms. Cowing was instructed to call 911 with any severe reactions post vaccine: Marland Kitchen Difficulty breathing  . Swelling of face and throat  . A fast heartbeat  . A bad rash all over body  . Dizziness and weakness   Immunizations Administered    Name Date Dose VIS Date Route   Pfizer COVID-19 Vaccine 10/17/2019  9:20 AM 0.3 mL 07/23/2019 Intramuscular   Manufacturer: [employer], [employer]   Lot: [id number]   NDC: [address]-5

## 2019-11-17 ENCOUNTER — Ambulatory Visit: Payer: Medicare Other | Attending: Internal Medicine

## 2019-11-17 DIAGNOSIS — Z23 Encounter for immunization: Secondary | ICD-10-CM

## 2019-11-17 NOTE — Progress Notes (Signed)
   Covid-19 Vaccination Clinic  Name:  Lori Fitzgerald    MRN: [id number] DOB: [birth year]  11/17/2019  [name] was observed post Covid-19 immunization for 30 minutes based on pre-vaccination screening without incident. She was provided with Vaccine Information Sheet and instruction to access the V-Safe system.   [name] was instructed to call 911 with any severe reactions post vaccine: Marland Kitchen Difficulty breathing  . Swelling of face and throat  . A fast heartbeat  . A bad rash all over body  . Dizziness and weakness   Immunizations Administered    Name Date Dose VIS Date Route   Pfizer COVID-19 Vaccine 11/17/2019  9:04 AM 0.3 mL 07/23/2019 Intramuscular   Manufacturer: [employer], [employer]   Lot: [id number]   NDC: [address]-1
